# Patient Record
Sex: Male | Born: 1990 | Hispanic: Yes | Marital: Married | Smoking: Never smoker
Health system: Southern US, Community
[De-identification: ages and names within clinical notes are randomized; demographics above are authoritative.]

---

## 2020-01-15 ENCOUNTER — Emergency Department (HOSPITAL_COMMUNITY)
Admission: EM | Admit: 2020-01-15 | Discharge: 2020-01-15 | Disposition: A | Discharge status: Home / Self Care | Payer: HRSA Program | Attending: Emergency Medicine | Admitting: Emergency Medicine

## 2020-01-15 ENCOUNTER — Emergency Department (HOSPITAL_COMMUNITY): Payer: HRSA Program

## 2020-01-15 ENCOUNTER — Other Ambulatory Visit: Payer: Self-pay

## 2020-01-15 ENCOUNTER — Encounter (HOSPITAL_COMMUNITY): Payer: Self-pay | Admitting: Emergency Medicine

## 2020-01-15 DIAGNOSIS — R0602 Shortness of breath: Secondary | ICD-10-CM | POA: Diagnosis present

## 2020-01-15 DIAGNOSIS — U071 COVID-19: Secondary | ICD-10-CM | POA: Insufficient documentation

## 2020-01-15 LAB — CBC
HCT: 48.5 % (ref 39.0–52.0)
Hemoglobin: 17 g/dL (ref 13.0–17.0)
MCH: 31.1 pg (ref 26.0–34.0)
MCHC: 35.1 g/dL (ref 30.0–36.0)
MCV: 88.7 fL (ref 80.0–100.0)
Platelets: 315 10*3/uL (ref 150–400)
RBC: 5.47 MIL/uL (ref 4.22–5.81)
RDW: 11.6 % (ref 11.5–15.5)
WBC: 4.8 10*3/uL (ref 4.0–10.5)
nRBC: 0 % (ref 0.0–0.2)

## 2020-01-15 LAB — BASIC METABOLIC PANEL
Anion gap: 11 (ref 5–15)
BUN: 8 mg/dL (ref 6–20)
CO2: 26 mmol/L (ref 22–32)
Calcium: 9.7 mg/dL (ref 8.9–10.3)
Chloride: 102 mmol/L (ref 98–111)
Creatinine, Ser: 0.71 mg/dL (ref 0.61–1.24)
GFR calc Af Amer: >60 mL/min (ref >60)
GFR calc non Af Amer: >60 mL/min (ref >60)
Glucose, Bld: 137 mg/dL — ABNORMAL HIGH (ref 70–99)
Potassium: 3.4 mmol/L — ABNORMAL LOW (ref 3.5–5.1)
Sodium: 139 mmol/L (ref 135–145)

## 2020-01-15 LAB — TROPONIN I (HIGH SENSITIVITY): Troponin I (High Sensitivity): 5 ng/L (ref <18)

## 2020-01-15 MED ORDER — DIPHENHYDRAMINE HCL 50 MG/ML IJ SOLN: 50.0000 mg | Freq: Once | INTRAMUSCULAR | Status: DC | PRN

## 2020-01-15 MED ORDER — METHYLPREDNISOLONE SODIUM SUCC 125 MG IJ SOLR: 125.0000 mg | Freq: Once | INTRAMUSCULAR | Status: DC | PRN

## 2020-01-15 MED ORDER — EPINEPHRINE 0.3 MG/0.3ML IJ SOAJ: 0.3000 mg | Freq: Once | INTRAMUSCULAR | Status: DC | PRN

## 2020-01-15 MED ORDER — SODIUM CHLORIDE 0.9 % IV SOLN: INTRAVENOUS | Status: DC | PRN

## 2020-01-15 MED ORDER — SODIUM CHLORIDE 0.9 % IV SOLN
1200.0000 mg | Freq: Once | INTRAVENOUS | Status: AC
  Administered 2020-01-15: 1200 mg via INTRAVENOUS
  Filled 2020-01-15: qty 10

## 2020-01-15 MED ORDER — FAMOTIDINE IN NACL 20-0.9 MG/50ML-% IV SOLN: 20.0000 mg | Freq: Once | INTRAVENOUS | Status: DC | PRN

## 2020-01-15 MED ORDER — ALBUTEROL SULFATE HFA 108 (90 BASE) MCG/ACT IN AERS
1.0000 | INHALATION_SPRAY | Freq: Once | RESPIRATORY_TRACT | Status: AC
  Administered 2020-01-15: 1 via RESPIRATORY_TRACT
  Filled 2020-01-15: qty 6.7

## 2020-01-15 NOTE — ED Triage Notes (Signed)
Pt c/o worsening shob x 3days, covid s/s since last week, COVID rapid test pos x 3 at [facility] testing center.  Pt speaking in short sentences, pain with deep inspiration.

## 2020-01-15 NOTE — ED Provider Notes (Signed)
[facility] EMERGENCY DEPARTMENT Provider Note   CSN: [id number] Arrival date & time: 01/15/20  1300     History Chief Complaint  Patient presents with  . Shortness of Breath    Caleb Perez is a 29 y.o. male with a past medical history of asthma as a child, obesity presenting to the ED with a chief complaint of shortness of breath, Covid infection.  States that 1 week ago he tested positive for Covid due to his wife being symptomatic.  He was having fevers at that time but otherwise did not have much of a respiratory illness.  Starting on 01/12/2020 started having worsening shortness of breath and cough productive with mucus.  He states that this worsened over the past day.  He has not taken any medications to help with his symptoms.  He was sent from urgent care to the ER for an antibody infusion.  He denies any vomiting but does endorse diarrhea.  Reports body aches as well.  States that his fevers have improved.  Denies any leg swelling, hemoptysis.  HPI     History reviewed. No pertinent past medical history.  There are no problems to display for this patient.   History reviewed. No pertinent surgical history.     No family history on file.  Social History   Tobacco Use  . Smoking status: Never Smoker  . Smokeless tobacco: Never Used  Substance Use Topics  . Alcohol use: Not Currently  . Drug use: Never    Home Medications Prior to Admission medications   Not on File    Allergies    Patient has no known allergies.  Review of Systems   Review of Systems  Constitutional: Positive for fever. Negative for appetite change and chills.  HENT: Negative for ear pain, rhinorrhea, sneezing and sore throat.   Eyes: Negative for photophobia and visual disturbance.  Respiratory: Positive for cough, chest tightness and shortness of breath. Negative for wheezing.   Cardiovascular: Negative for chest pain and palpitations.  Gastrointestinal: Positive for  diarrhea. Negative for abdominal pain, blood in stool, constipation, nausea and vomiting.  Genitourinary: Negative for dysuria, hematuria and urgency.  Musculoskeletal: Positive for myalgias.  Skin: Negative for rash.  Neurological: Negative for dizziness, weakness and light-headedness.    Physical Exam Updated Vital Signs BP 124/83 (BP Location: Right Arm)   Pulse 82   Temp 98.8 F (37.1 C) (Oral)   Resp (!) 25   Ht 5\' 9"  (1.753 m)   Wt 93.4 kg   SpO2 99%   BMI 30.42 kg/m   Physical Exam Vitals and nursing note reviewed.  Constitutional:      General: He is not in acute distress.    Appearance: He is well-developed.  HENT:     Head: Normocephalic and atraumatic.     Nose: Nose normal.  Eyes:     General: No scleral icterus.       Left eye: No discharge.     Conjunctiva/sclera: Conjunctivae normal.  Cardiovascular:     Rate and Rhythm: Normal rate and regular rhythm.     Heart sounds: Normal heart sounds. No murmur heard.  No friction rub. No gallop.   Pulmonary:     Effort: Pulmonary effort is normal. Tachypnea present. No respiratory distress.     Breath sounds: Normal breath sounds.  Abdominal:     General: Bowel sounds are normal. There is no distension.     Palpations: Abdomen is soft.  Tenderness: There is no abdominal tenderness. There is no guarding.  Musculoskeletal:        General: Normal range of motion.     Cervical back: Normal range of motion and neck supple.  Skin:    General: Skin is warm and dry.     Findings: No rash.  Neurological:     Mental Status: He is alert.     Motor: No abnormal muscle tone.     Coordination: Coordination normal.     ED Results / Procedures / Treatments   Labs (all labs ordered are listed, but only abnormal results are displayed) Labs Reviewed  BASIC METABOLIC PANEL - Abnormal; Notable for the following components:      Result Value   Potassium 3.4 (*)    Glucose, Bld 137 (*)    All other components within  normal limits  CBC  TROPONIN I (HIGH SENSITIVITY)    EKG None  Radiology DG Chest Portable 1 View  Result Date: 01/15/2020 CLINICAL DATA:  Shortness of breath.  COVID positive. EXAM: PORTABLE CHEST 1 VIEW COMPARISON:  None. FINDINGS: Lung volumes are low. Patchy heterogeneous bilateral airspace opacities in a mid-lower lung zone predominant distribution. Upper normal heart size likely accentuated by technique and low lung volumes. No pleural fluid or pneumothorax. No evidence of pneumomediastinum. No acute osseous abnormalities are seen. IMPRESSION: Patchy heterogeneous bilateral airspace opacities in a mid-lower lung zone predominant distribution consistent with COVID-19 pneumonia. Electronically Signed   By: Narda Rutherford M.D.   On: 01/15/2020 16:16    Procedures Procedures (including critical care time)  Medications Ordered in ED Medications  albuterol (VENTOLIN HFA) 108 (90 Base) MCG/ACT inhaler 1 puff (has no administration in time range)  casirivimab-imdevimab (REGEN-COV) 1,200 mg in sodium chloride 0.9 % 110 mL IVPB (has no administration in time range)  0.9 %  sodium chloride infusion (has no administration in time range)  diphenhydrAMINE (BENADRYL) injection 50 mg (has no administration in time range)  famotidine (PEPCID) IVPB 20 mg premix (has no administration in time range)  methylPREDNISolone sodium succinate (SOLU-MEDROL) 125 mg/2 mL injection 125 mg (has no administration in time range)  EPINEPHrine (EPI-PEN) injection 0.3 mg (has no administration in time range)    ED Course  I have reviewed the triage vital signs and the nursing notes.  Pertinent labs & imaging results that were available during my care of the patient were reviewed by me and considered in my medical decision making (see chart for details).    MDM Rules/Calculators/A&P                          Caleb Perez was evaluated in Emergency Department on 01/15/20 for the symptoms described in the  history of present illness. He/she was evaluated in the context of the global COVID-19 pandemic, which necessitated consideration that the patient might be at risk for infection with the SARS-CoV-2 virus that causes COVID-19. Institutional protocols and algorithms that pertain to the evaluation of patients at risk for COVID-19 are in a state of rapid change based on information released by regulatory bodies including the CDC and federal and state organizations. These policies and algorithms were followed during the patient's care in the ED.  29 year old male with a past medical history of asthma as a child, obesity presenting to the ED with symptomatic Covid infection.  Was diagnosed 1 week ago with mild symptoms including fever and body aches.  For the past 3  to 4 days has been feeling more short of breath with a productive cough that has worsened in the past day.  Has not been taking medications at home.  Reports diarrhea and body aches as well.  States that his fever has overall resolved.  Patient tachypneic during evaluation but lungs without any wheezing.  His oxygen saturations are above 96% on room air.  Chest x-ray here shows evidence of multifocal Covid pneumonia.  BMP, CBC and troponin unremarkable.  EKG shows sinus rhythm.  Due to patient's BMI of 30 and history of asthma, he qualifies for a Mab infusion.  I have ordered this in the ED.  Patient is agreeable to this. Patient will need to be assessed after Mab infusion and will likely be discharged.   All imaging, if done today, including plain films, CT scans, and ultrasounds, independently reviewed by me, and interpretations confirmed via formal radiology reads.  Portions of this note were generated with Scientist, clinical (histocompatibility and immunogenetics). Dictation errors may occur despite best attempts at proofreading.  Final Clinical Impression(s) / ED Diagnoses Final diagnoses:  COVID-19 virus infection    Rx / DC Orders ED Discharge Orders    None        Dietrich Pates, PA-C 01/15/20 1854    Charlynne Pander, MD 01/15/20 1859

## 2020-01-15 NOTE — ED Notes (Signed)
Patient verbalizes understanding of discharge instructions. Opportunity for questioning and answers were provided. Armband removed by staff, pt discharged from ED stable & ambulatory  

## 2020-01-15 NOTE — Discharge Instructions (Addendum)
Return to the ED if you start to experience chest pain, shortness of breath, leg swelling or ongoing vomiting.

## 2021-05-17 IMAGING — CR DG CHEST 1V PORT
1 series · 1 of 1 positions shown · non-contrast
Comparison: None.

CLINICAL DATA: Shortness of breath.  COVID positive.

EXAM:
PORTABLE CHEST 1 VIEW

[AP]
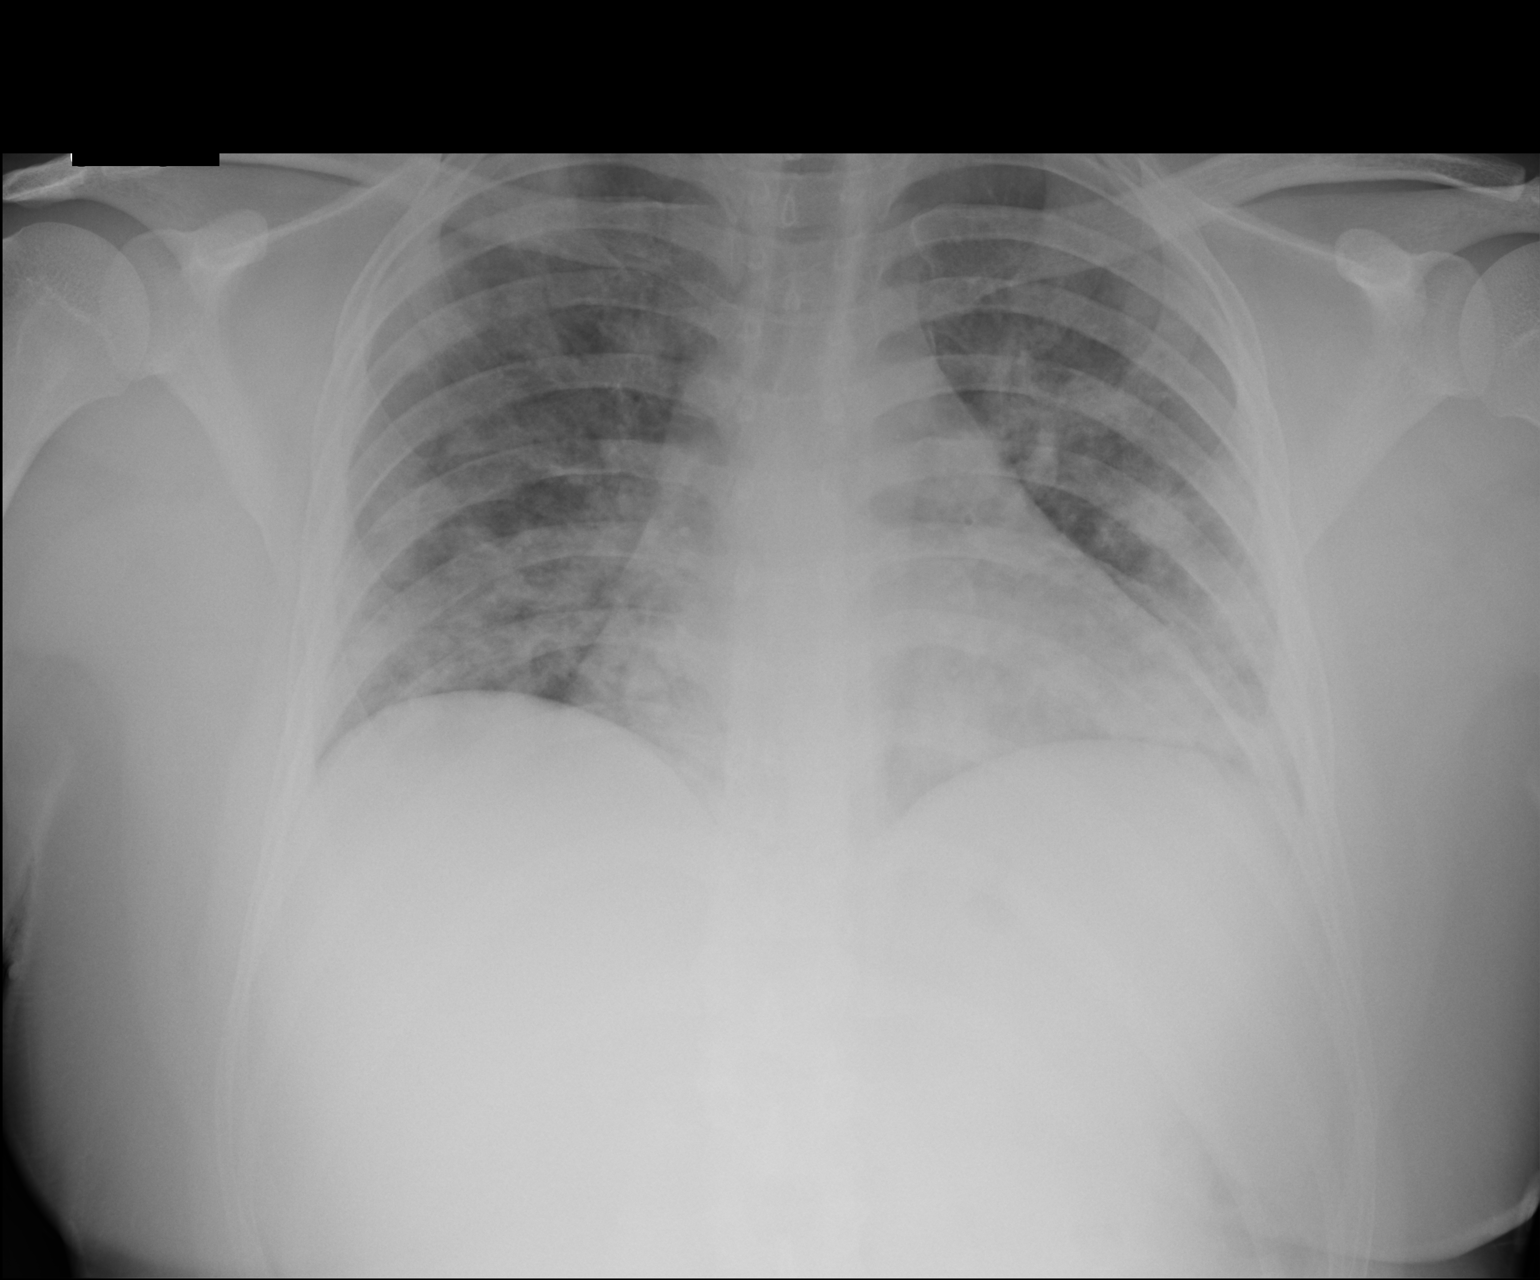

[1 of 1 positions shown; findings below may reference images not displayed]

FINDINGS: Lung volumes are low. Patchy heterogeneous bilateral airspace
opacities in a mid-lower lung zone predominant distribution. Upper
normal heart size likely accentuated by technique and low lung
volumes. No pleural fluid or pneumothorax. No evidence of
pneumomediastinum. No acute osseous abnormalities are seen.
IMPRESSION: Patchy heterogeneous bilateral airspace opacities in a mid-lower
lung zone predominant distribution consistent with [id number]
pneumonia.
